# Patient Record
Sex: Male | Born: 1980 | Marital: Married | Smoking: Never smoker
Health system: Southern US, Academic
[De-identification: ages and names within clinical notes are randomized; demographics above are authoritative.]

## PROBLEM LIST (undated history)

## (undated) DIAGNOSIS — I10 Essential (primary) hypertension: Secondary | ICD-10-CM

## (undated) HISTORY — DX: Essential (primary) hypertension: I10

## (undated) HISTORY — PX: KNEE ARTHROSCOPY: SHX127

---

## 2016-02-20 ENCOUNTER — Emergency Department (HOSPITAL_COMMUNITY)
Admission: EM | Admit: 2016-02-20 | Discharge: 2016-02-20 | Disposition: A | Discharge status: Home / Self Care | Payer: BLUE CROSS/BLUE SHIELD | Attending: Emergency Medicine | Admitting: Emergency Medicine

## 2016-02-20 ENCOUNTER — Encounter (HOSPITAL_COMMUNITY): Payer: Self-pay | Admitting: Emergency Medicine

## 2016-02-20 ENCOUNTER — Emergency Department (HOSPITAL_COMMUNITY): Payer: BLUE CROSS/BLUE SHIELD

## 2016-02-20 DIAGNOSIS — Y999 Unspecified external cause status: Secondary | ICD-10-CM | POA: Insufficient documentation

## 2016-02-20 DIAGNOSIS — Z87891 Personal history of nicotine dependence: Secondary | ICD-10-CM | POA: Insufficient documentation

## 2016-02-20 DIAGNOSIS — S92502A Displaced unspecified fracture of left lesser toe(s), initial encounter for closed fracture: Secondary | ICD-10-CM

## 2016-02-20 DIAGNOSIS — Y939 Activity, unspecified: Secondary | ICD-10-CM | POA: Insufficient documentation

## 2016-02-20 DIAGNOSIS — Y929 Unspecified place or not applicable: Secondary | ICD-10-CM | POA: Diagnosis not present

## 2016-02-20 DIAGNOSIS — Z79899 Other long term (current) drug therapy: Secondary | ICD-10-CM | POA: Insufficient documentation

## 2016-02-20 DIAGNOSIS — I10 Essential (primary) hypertension: Secondary | ICD-10-CM | POA: Insufficient documentation

## 2016-02-20 DIAGNOSIS — W2209XA Striking against other stationary object, initial encounter: Secondary | ICD-10-CM | POA: Insufficient documentation

## 2016-02-20 DIAGNOSIS — S92515A Nondisplaced fracture of proximal phalanx of left lesser toe(s), initial encounter for closed fracture: Secondary | ICD-10-CM | POA: Diagnosis not present

## 2016-02-20 DIAGNOSIS — S90129A Contusion of unspecified lesser toe(s) without damage to nail, initial encounter: Secondary | ICD-10-CM

## 2016-02-20 DIAGNOSIS — S99922A Unspecified injury of left foot, initial encounter: Secondary | ICD-10-CM | POA: Diagnosis present

## 2016-02-20 HISTORY — DX: Essential (primary) hypertension: I10

## 2016-02-20 MED ORDER — NAPROXEN 500 MG PO TABS: 500.0000 mg | ORAL_TABLET | Freq: Two times a day (BID) | ORAL | 0 refills | Status: AC

## 2016-02-20 NOTE — Progress Notes (Signed)
Previous nursing note written  by nurse, Rodman KeyJanet Lucianna Ostlund. Pt taken for x-ray via wheelchair.

## 2016-02-20 NOTE — ED Triage Notes (Signed)
Pt states he stubbed his toe during the middle of the night about one week ago. The pain and swelling have worsened. Pt has pain with ambulation

## 2016-02-20 NOTE — ED Provider Notes (Signed)
[facility]-EMERGENCY DEPT Provider Note   CSN: [id number] Arrival date & time: 02/20/16  1448  By signing my name below, I, Soijett Blue, attest that this documentation has been prepared under the direction and in the presence of Alishah Schulte Y. Jayla Mackie, PA-C Electronically Signed: Soijett Blue, ED Scribe. 02/20/16. 3:35 PM.  History   Chief Complaint Chief Complaint  Patient presents with  . Toe Pain    pt injured his left pinky toe abotu one week ago. swelling and pain    HPI  Grant Graham is a 35 y.o. male with a PMH of HTN, who presents to the Emergency Department complaining of left pinky toe pain onset 9 days ago. Pt reports that he stubbed his left pinky toe in the middle of the night on a door. Pt notes that he has been ambulating since the incident occurred. Pt states that his left pinky toe pain is worsened with movement, ambulation, and palpation. Pt denies alleviating factors for his left pinky toe. Pt states that he is from [place] and he is in [place] working on [employer] and he is returning at the end of the week. Pt is having associated symptoms of gait problem due to pain, redness to left pinky toe, and left pinky toe swelling. He notes that he has not tried any medications for the relief of his symptoms. He denies wound, rash, and any other symptoms.     The history is provided by the patient. No language interpreter was used.    Past Medical History:  Diagnosis Date  . Hypertension     There are no active problems to display for this patient.   Past Surgical History:  Procedure Laterality Date  . 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 Authorizing Provider  fexofenadine (ALLEGRA) 180 MG tablet Take 180 mg by mouth daily.   Yes Historical Provider, MD  hydrochlorothiazide (MICROZIDE) 12.5 MG capsule Take 12.5 mg by  mouth daily.   Yes Historical Provider, MD  Multiple Vitamin (MULTIVITAMIN WITH MINERALS) TABS tablet Take 1 tablet by mouth daily.   Yes Historical Provider, MD  omeprazole (PRILOSEC) 40 MG capsule Take 40 mg by mouth daily.   Yes Historical Provider, MD  ramipril (ALTACE) 5 MG capsule Take 5 mg by mouth daily.   Yes Historical Provider, MD    Family History History reviewed. No pertinent family history.  Social History Social History  Substance Use Topics  . Smoking status: Former Smoker    Packs/day: 1.00    Years: 15.00    Types: Cigarettes    Quit date: 07/21/2015  . Smokeless tobacco: Never Used  . Alcohol use No     Allergies   Review of patient's allergies indicates not on file.   Review of Systems Review of Systems  Musculoskeletal: Positive for arthralgias (left pinky toe), gait problem (due to pain) and joint swelling (left pinky toe).  Skin: Positive for color change (redness to left pinky toe). Negative for rash and wound.  All other systems reviewed and are negative.    Physical Exam Updated Vital Signs BP 132/100 (BP Location: Right Arm)   Pulse 91   Temp 97.7 F (36.5 C) (Oral)   Resp 18   Ht 6' (1.829 m)   Wt 240 lb (108.9 kg)   SpO2 98%  BMI 32.55 kg/m   Physical Exam  Constitutional: He is oriented to person, place, and time. He appears well-developed and well-nourished. No distress.  Well appearing. NAD.  HENT:  Head: Normocephalic and atraumatic.  Eyes: EOM are normal.  Neck: Neck supple.  Cardiovascular: Normal rate.   2+ dp pulses.   Pulmonary/Chest: Effort normal. No respiratory distress.  Abdominal: He exhibits no distension.  Musculoskeletal: Normal range of motion.       Left foot: There is tenderness and swelling.  Left fifth toe edematous, mild erythematous, diffusely tender. Tenderness extends through 5th metatarsal area.  Neurological: He is alert and oriented to person, place, and time.  Skin: Skin is warm and dry.  Brisk  cap refill.   Psychiatric: He has a normal mood and affect. His behavior is normal.  Nursing note and vitals reviewed.    ED Treatments / Results  DIAGNOSTIC STUDIES: Oxygen Saturation is 98% on RA, nl by my interpretation.    COORDINATION OF CARE: 3:12 PM Discussed treatment plan with pt at bedside which includes left 5th toe xray and pt agreed to plan.   Radiology Dg Toe 5th Left  Result Date: 02/20/2016 CLINICAL DATA:  35 year old male with injury to the left fifth toe 1 week ago. Pain and swelling. EXAM: DG TOE 5TH LEFT COMPARISON:  No priors. FINDINGS: Multiple views of the left fifth toe demonstrate an oblique nondisplaced fracture of the proximal phalanx which appears minimally comminuted. Chronic fusion of the mid and distal phalanges of the fifth toe are also incidentally noted. IMPRESSION: 1. Minimally comminuted nondisplaced nonangulated oblique fracture of the fifth proximal phalanx of the left foot. Electronically Signed   By: Trudie Reedaniel  Entrikin M.D.   On: 02/20/2016 15:31    Procedures Procedures (including critical care time)  Medications Ordered in ED Medications - No data to display   Initial Impression / Assessment and Plan / ED Course  I have reviewed the triage vital signs and the nursing notes.  Pertinent imaging results that were available during my care of the patient were reviewed by me and considered in my medical decision making (see chart for details).  Clinical Course   Patient X-Ray positive for minimally comminuted nondisplaced nonangulated oblique fracture of the fifth proximal phalanx of the left foot. Pt advised to follow up with orthopedics. Patient given buddy tape while in ED. Will discharge home with naprosyn Rx. Instructed f/u with ortho when he returns home to [place]. Patient will be discharged home & is agreeable with above plan. Returns precautions discussed. Pt appears safe for discharge.  Final Clinical Impressions(s) / ED Diagnoses     Final diagnoses:  Toe contusion  Closed fracture of fifth toe of left foot    New Prescriptions New Prescriptions   NAPROXEN (NAPROSYN) 500 MG TABLET    Take 1 tablet (500 mg total) by mouth 2 (two) times daily.    I personally performed the services described in this documentation, which was scribed in my presence. The recorded information has been reviewed and is accurate.    Carlene CoriaSerena Y Thatiana Renbarger, PA-C 02/20/16 1545    Azalia BilisKevin Campos, MD 02/20/16 [phone]

## 2016-02-20 NOTE — Discharge Instructions (Signed)
Follow up with your local orthopedic surgeon when you return to [place]. In the meantime continue buddy tape on your toes for compression and support. Take naproxen as needed for pain. Return to the ER for new or worsening symptoms.

## 2017-10-12 IMAGING — CR DG TOE 5TH 2+V*L*
3 series · 3 of 3 positions shown · non-contrast
Comparison: No priors.

CLINICAL DATA: 35-year-old male with injury to the left fifth toe 1
week ago. Pain and swelling.

EXAM:
DG TOE 5TH LEFT

[x toes ap left]
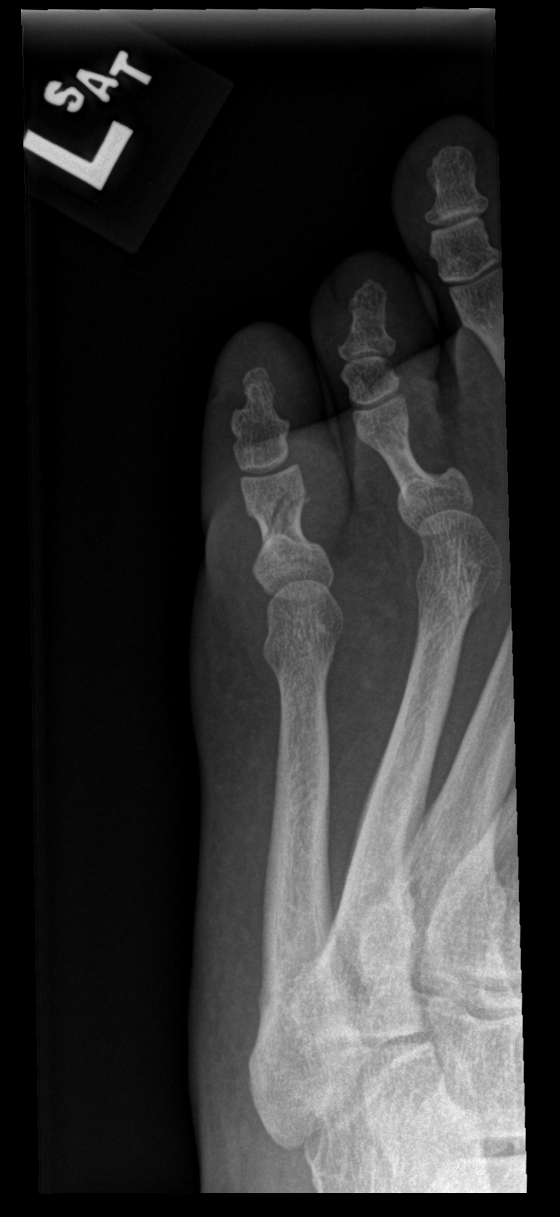

[x toes obl left]
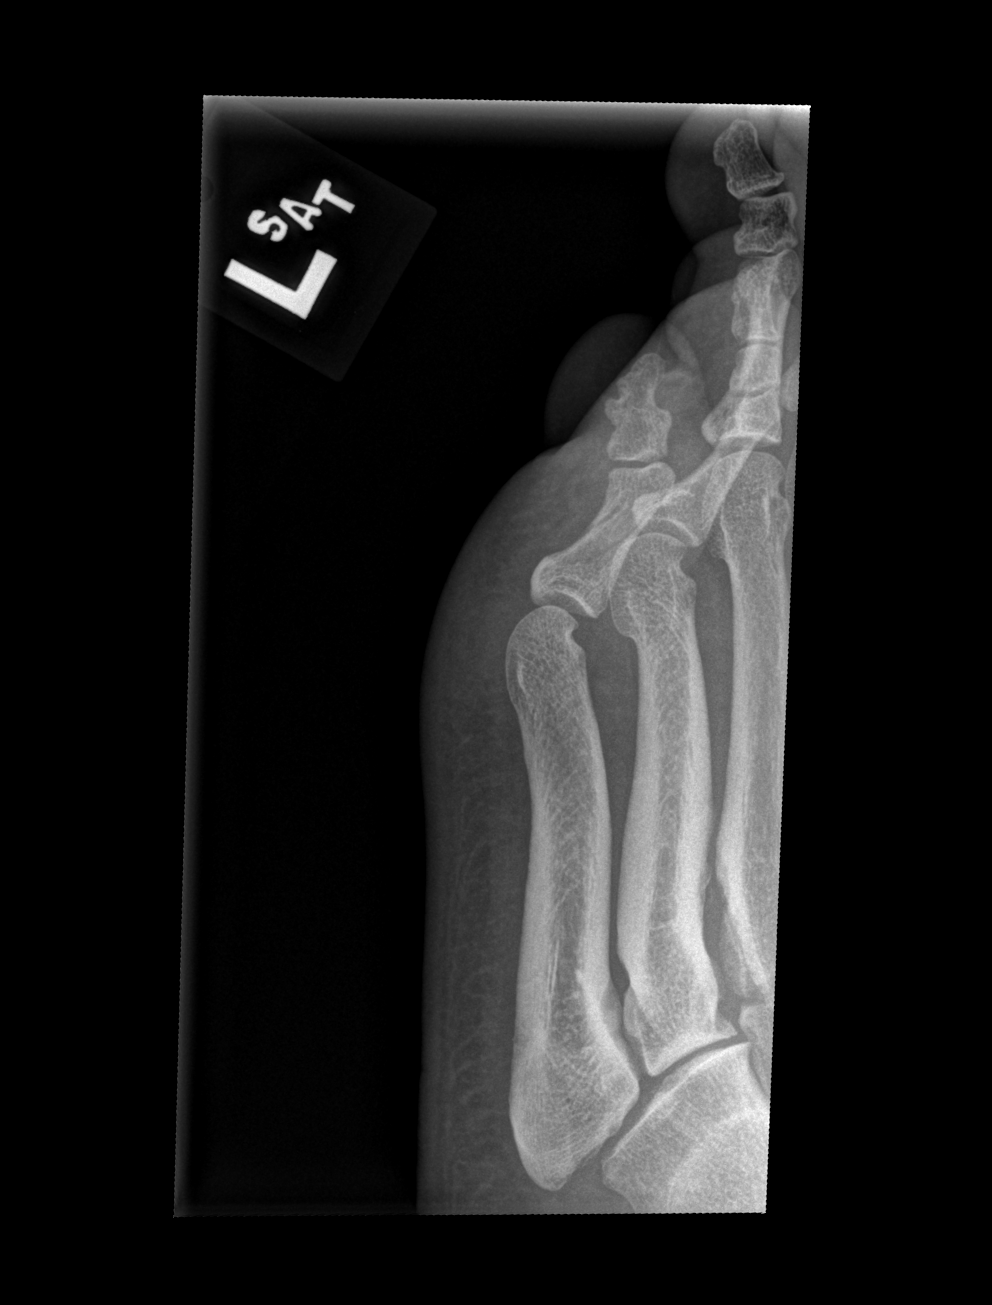

[x toes lat left]
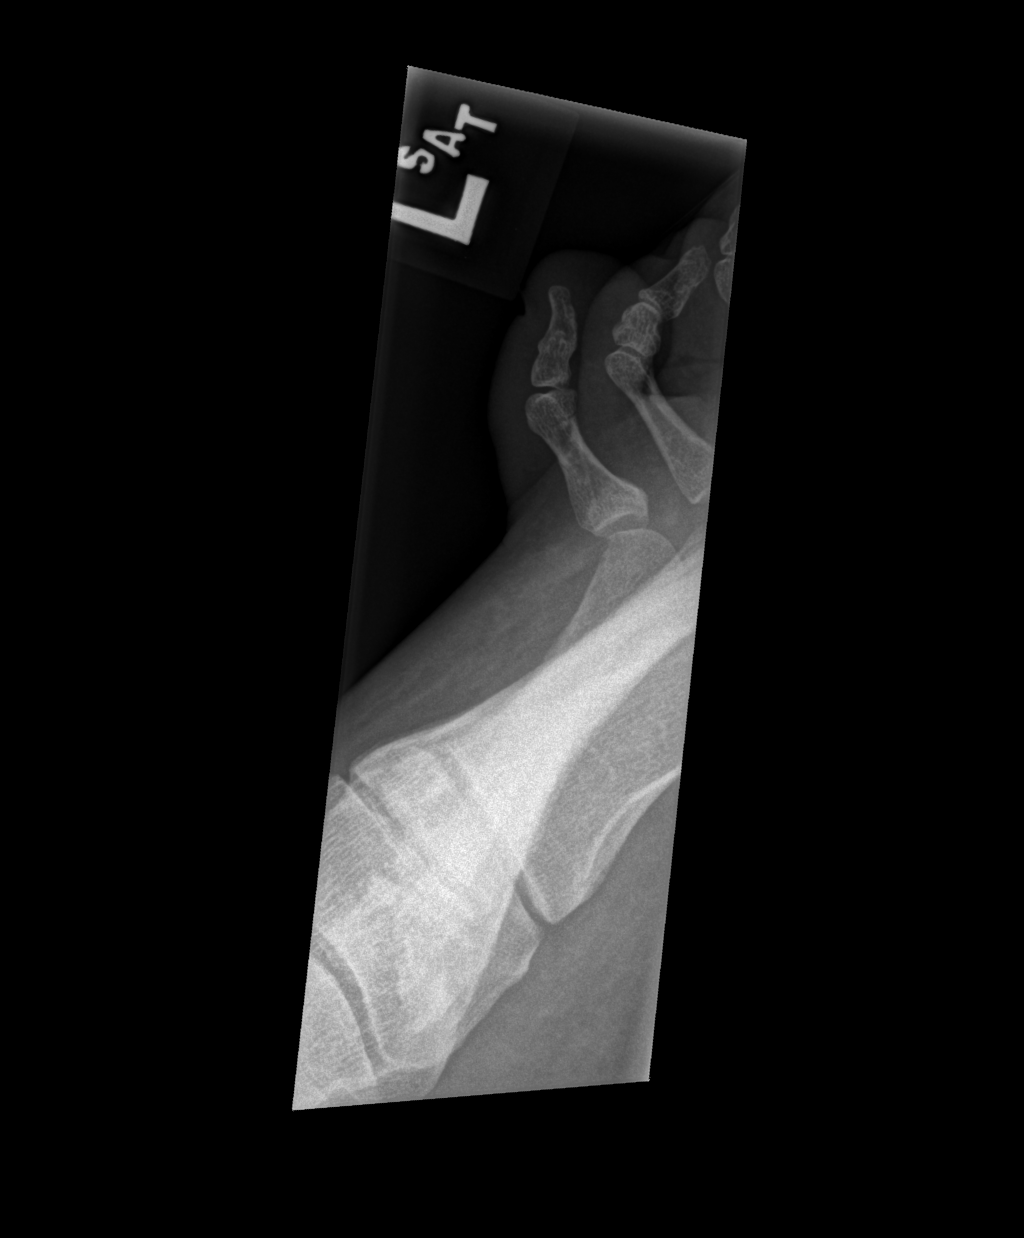

[3 of 3 positions shown; findings below may reference images not displayed]

FINDINGS: Multiple views of the left fifth toe demonstrate an oblique
nondisplaced fracture of the proximal phalanx which appears
minimally comminuted. Chronic fusion of the mid and distal phalanges
of the fifth toe are also incidentally noted.
IMPRESSION: 1. Minimally comminuted nondisplaced nonangulated oblique fracture
of the fifth proximal phalanx of the left foot.

## 2021-10-04 ENCOUNTER — Other Ambulatory Visit (HOSPITAL_COMMUNITY): Payer: Self-pay | Admitting: FAMILY PRACTICE

## 2022-02-24 ENCOUNTER — Other Ambulatory Visit: Payer: Self-pay

## 2022-02-24 ENCOUNTER — Ambulatory Visit (INDEPENDENT_AMBULATORY_CARE_PROVIDER_SITE_OTHER): Payer: BC Managed Care – PPO | Admitting: PHYSICIAN ASSISTANT

## 2022-02-24 ENCOUNTER — Other Ambulatory Visit (INDEPENDENT_AMBULATORY_CARE_PROVIDER_SITE_OTHER): Payer: Self-pay | Admitting: PHYSICIAN ASSISTANT

## 2022-02-24 ENCOUNTER — Encounter (INDEPENDENT_AMBULATORY_CARE_PROVIDER_SITE_OTHER): Payer: Self-pay | Admitting: PHYSICIAN ASSISTANT

## 2022-02-24 VITALS — BP 130/91 | HR 88 | Resp 16 | Ht 72.0 in | Wt 250.0 lb

## 2022-02-24 DIAGNOSIS — M5090 Cervical disc disorder, unspecified, unspecified cervical region: Secondary | ICD-10-CM

## 2022-02-24 DIAGNOSIS — M542 Cervicalgia: Secondary | ICD-10-CM

## 2022-02-24 DIAGNOSIS — M5412 Radiculopathy, cervical region: Secondary | ICD-10-CM

## 2022-02-24 DIAGNOSIS — M5013 Cervical disc disorder with radiculopathy, cervicothoracic region: Secondary | ICD-10-CM
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
## 2022-03-03 ENCOUNTER — Other Ambulatory Visit (INDEPENDENT_AMBULATORY_CARE_PROVIDER_SITE_OTHER): Payer: Self-pay | Admitting: PHYSICIAN ASSISTANT

## 2022-03-28 ENCOUNTER — Telehealth (INDEPENDENT_AMBULATORY_CARE_PROVIDER_SITE_OTHER): Payer: Self-pay | Admitting: Neurological Surgery

## 2022-03-28 NOTE — Telephone Encounter (Signed)
Auth request for CT nerve block [id number] submitted with [employer] order ID [id number].

## 2022-04-04 NOTE — Telephone Encounter (Signed)
Patient notified insurance denied CT cervical nerve block/must complete 6wks both medication/PT with follow up documenting progress. Pt has not yet started PT/to contact office to arrange telehealth once PT has been initiated.
